# Patient Record
Sex: Male | Born: 1985 | Race: White | Hispanic: No | Marital: Single | Smoking: Current every day smoker
Health system: Southern US, Community
[De-identification: ages and names within clinical notes are randomized; demographics above are authoritative.]

---

## 2017-05-18 ENCOUNTER — Emergency Department
Admission: EM | Admit: 2017-05-18 | Discharge: 2017-05-18 | Disposition: A | Discharge status: Home / Self Care | Payer: Self-pay | Attending: Emergency Medicine | Admitting: Emergency Medicine

## 2017-05-18 DIAGNOSIS — F17210 Nicotine dependence, cigarettes, uncomplicated: Secondary | ICD-10-CM | POA: Insufficient documentation

## 2017-05-18 DIAGNOSIS — G43001 Migraine without aura, not intractable, with status migrainosus: Secondary | ICD-10-CM | POA: Insufficient documentation

## 2017-05-18 MED ORDER — ONDANSETRON HCL 4 MG/2ML IJ SOLN
4.0000 mg | Freq: Once | INTRAMUSCULAR | Status: AC
Start: 2017-05-18 — End: 2017-05-18
  Administered 2017-05-18: 4 mg via INTRAVENOUS
  Filled 2017-05-18: qty 2

## 2017-05-18 MED ORDER — SODIUM CHLORIDE 0.9 % IV BOLUS (SEPSIS)
1000.0000 mL | Freq: Once | INTRAVENOUS | Status: AC
  Administered 2017-05-18: 1000 mL via INTRAVENOUS

## 2017-05-18 MED ORDER — KETOROLAC TROMETHAMINE 30 MG/ML IJ SOLN
30.0000 mg | Freq: Once | INTRAMUSCULAR | Status: AC
Start: 2017-05-18 — End: 2017-05-18
  Administered 2017-05-18: 30 mg via INTRAVENOUS
  Filled 2017-05-18: qty 1

## 2017-05-18 MED ORDER — DIPHENHYDRAMINE HCL 50 MG/ML IJ SOLN
25.0000 mg | Freq: Once | INTRAMUSCULAR | Status: AC
  Administered 2017-05-18: 25 mg via INTRAVENOUS
  Filled 2017-05-18: qty 1

## 2017-05-18 MED ORDER — BUTALBITAL-APAP-CAFFEINE 50-325-40 MG PO TABS: 1.0000 | ORAL_TABLET | Freq: Four times a day (QID) | ORAL | 0 refills | Status: AC | PRN

## 2017-05-18 MED ORDER — METOCLOPRAMIDE HCL 5 MG/ML IJ SOLN
20.0000 mg | Freq: Once | INTRAVENOUS | Status: AC
  Administered 2017-05-18: 20 mg via INTRAVENOUS
  Filled 2017-05-18: qty 4

## 2017-05-18 NOTE — ED Notes (Signed)
Pt presents with headaches 1-2 x per week for a month; now has one that has lasted for 24 hours. He went to urgent care for this headache yesterday and was given phenergan, topamax and benadryl. Pt reports that the headache never got better, but he was able to sleep some. Nausea/vomiting stopped. Pt states he was given prescriptions for the same but has not taken them since it has not been 24 hours. Pt states that he has not seen pcp because up until a month ago, he had rare headaches that would go away with sleep or ibuprofen. Pt alert & oriented with NAD noted.

## 2017-05-18 NOTE — ED Provider Notes (Signed)
[facility] Emergency Department Provider Note   ____________________________________________   First MD Initiated Contact with Patient 05/18/17 1320     (approximate)  I have reviewed the triage vital signs and the nursing notes.   HISTORY  Chief Complaint Headache    HPI Connor Edwards is a 31 y.o. male patient complaining of headache starting yesterday. Patient went to urgent care Clinic and was given Topamax and Benadryl. Patient stated no relief with this medication. Patient state he also had nausea and vomiting associated headache but that has resided to just mild nausea. Patient state onset headaches began about a month ago and has increased to 1-2 a week. Patient said normally headache resolved with anti-inflammatory medication and sleep. Patient also reported a lot of stress in the past 2-3 weeks. Patient denies or associated with this headache. Patient currently rates his painas a 4/10.   History reviewed. No pertinent past medical history.  There are no active problems to display for this patient.   History reviewed. No pertinent surgical history.  Prior to Admission medications   Medication Sig Start Date End Date Taking? Authorizing Provider  butalbital-acetaminophen-caffeine (FIORICET, ESGIC) [phone] MG tablet Take 1-2 tablets by mouth every 6 (six) hours as needed for headache. 05/18/17 05/18/18  Joni ReiningSmith, Ronald K, PA-C    Allergies Patient has no known allergies.  No family history on file.  Social History Social History  Substance Use Topics  . Smoking status: Current Every Day Smoker    Packs/day: 1.00    Types: Cigarettes  . Smokeless tobacco: Never Used  . Alcohol use No    Review of Systems  Constitutional: No fever/chills Eyes: No visual changes. ENT: No sore throat. Cardiovascular: Denies chest pain. Respiratory: Denies shortness of breath. Gastrointestinal: nausea and vomiting Genitourinary: Negative for  dysuria. Musculoskeletal: Negative for back pain. Skin: Negative for rash. Neurological: positive for headaches, but denies focal weakness or numbness.   ____________________________________________   PHYSICAL EXAM:  VITAL SIGNS: ED Triage Vitals  Enc Vitals Group     BP 05/18/17 1135 128/79     Pulse Rate 05/18/17 1135 88     Resp 05/18/17 1135 16     Temp 05/18/17 1135 98.5 F (36.9 C)     Temp Source 05/18/17 1135 Oral     SpO2 05/18/17 1135 100 %     Weight 05/18/17 1135 186 lb (84.4 kg)     Height 05/18/17 1135 5\' 11"  (1.803 m)     Head Circumference --      Peak Flow --      Pain Score 05/18/17 1134 4     Pain Loc --      Pain Edu? --      Excl. in GC? --     Constitutional: Alert and oriented. Well appearing and in no acute distress. Eyes: photophobic Head: Atraumatic. Nose: No congestion/rhinnorhea. Mouth/Throat: Mucous membranes are moist.  Oropharynx non-erythematous. Cardiovascular: Normal rate, regular rhythm. Grossly normal heart sounds.  Good peripheral circulation. Respiratory: Normal respiratory effort.  No retractions. Lungs CTAB. Neurologic:  Normal speech and language. No gross focal neurologic deficits are appreciated. No gait instability. Skin:  Skin is warm, dry and intact. No rash noted. Psychiatric: Mood and affect are normal. Speech and behavior are normal.  ____________________________________________   LABS (all labs ordered are listed, but only abnormal results are displayed)  Labs Reviewed - No data to display ____________________________________________  EKG   ____________________________________________  RADIOLOGY   ____________________________________________  PROCEDURES  Procedure(s) performed: None  Procedures  Critical Care performed: No  ____________________________________________   INITIAL IMPRESSION / ASSESSMENT AND PLAN / ED COURSE  Pertinent labs & imaging results that were available during my care of  the patient were reviewed by me and considered in my medical decision making (see chart for details).  Patient complaint consist of migraine headache. Patient states approximately 80% reduction in headache status post IV Toradol, Reglan, Benadryl, and normal saline.patient given discharge Instructions and a work note. [facility]      ____________________________________________   FINAL CLINICAL IMPRESSION(S) / ED DIAGNOSES  Final diagnoses:  Migraine without aura and with status migrainosus, not intractable      NEW MEDICATIONS STARTED DURING THIS VISIT:  New Prescriptions   BUTALBITAL-ACETAMINOPHEN-CAFFEINE (FIORICET, ESGIC) 50-325-40 MG TABLET    Take 1-2 tablets by mouth every 6 (six) hours as needed for headache.     Note:  This document was prepared using Dragon voice recognition software and may include unintentional dictation errors.    Joni Reining, PA-C 05/18/17 1452    Jene Every, MD 05/18/17 [phone]

## 2017-05-18 NOTE — ED Notes (Signed)
Pt discharged home after verbalizing understanding of discharge instructions; nad noted. 

## 2017-05-18 NOTE — ED Triage Notes (Signed)
Pt reports he started with a headache yesterday - pt went to urgent care and was given topamax and benadryl but obtained no relief - pt reports that the N/V has subsided but the headache persist - pt states that he started having severe headaches about a month ago (1-2 times a week) - pt reports that he is under a lot of stress recently

## 2018-09-23 ENCOUNTER — Other Ambulatory Visit: Payer: Self-pay

## 2018-09-23 ENCOUNTER — Emergency Department
Admission: EM | Admit: 2018-09-23 | Discharge: 2018-09-23 | Disposition: A | Discharge status: Home / Self Care | Payer: Self-pay | Attending: Emergency Medicine | Admitting: Emergency Medicine

## 2018-09-23 DIAGNOSIS — H6121 Impacted cerumen, right ear: Secondary | ICD-10-CM | POA: Insufficient documentation

## 2018-09-23 DIAGNOSIS — H9201 Otalgia, right ear: Secondary | ICD-10-CM

## 2018-09-23 DIAGNOSIS — F17210 Nicotine dependence, cigarettes, uncomplicated: Secondary | ICD-10-CM | POA: Insufficient documentation

## 2018-09-23 MED ORDER — CARBAMIDE PEROXIDE 6.5 % OT SOLN
5.0000 [drp] | Freq: Once | OTIC | Status: AC
  Administered 2018-09-23: 5 [drp] via OTIC
  Filled 2018-09-23: qty 15

## 2018-09-23 NOTE — ED Triage Notes (Signed)
Pt c/o right ear pain.

## 2018-09-23 NOTE — ED Provider Notes (Signed)
[facility] Emergency Department Provider Note ____________________________________________  Time seen: 64  I have reviewed the triage vital signs and the nursing notes.  HISTORY  Chief Complaint  Otalgia   HPI Connor Edwards is a 32 y.o. male's to the ER today with complaint of right ear pain.  He reports this started 4 days ago.  He describes the pain as achy.  He does have associated muffled hearing and ringing in the right ear.  He denies runny nose, nasal congestion, sore throat or cough.  He denies fever, chills or body aches.  He did try to flush his right ear out 4 days ago and his symptoms got worse after that.  History reviewed. No pertinent past medical history.  There are no active problems to display for this patient.   History reviewed. No pertinent surgical history.  Prior to Admission medications   Not on File    Allergies Patient has no known allergies.  No family history on file.  Social History Social History   Tobacco Use  . Smoking status: Current Every Day Smoker    Packs/day: 1.00    Types: Cigarettes  . Smokeless tobacco: Never Used  Substance Use Topics  . Alcohol use: No  . Drug use: No    Review of Systems  Constitutional: Negative for fever, chills or body aches. ENT: Positive for right ear pain, muffled hearing and ringing in the right ear.  Negative for runny nose, nasal congestion or sore throat. Cardiovascular: Negative for chest pain. Respiratory: Negative for shortness of breath. ____________________________________________  PHYSICAL EXAM:  VITAL SIGNS: ED Triage Vitals [09/23/18 1821]  Enc Vitals Group     BP (!) 154/93     Pulse Rate 98     Resp 18     Temp 98 F (36.7 C)     Temp src      SpO2 99 %     Weight 205 lb (93 kg)     Height 5\' 11"  (1.803 m)     Head Circumference      Peak Flow      Pain Score 3     Pain Loc      Pain Edu?      Excl. in GC?     Constitutional: Alert and  oriented. Well appearing and in no distress. Head: Normocephalic and atraumatic. Ears: Left TM gray and intact, normal light reflex.  Right ear with cerumen impaction. Hematological/Lymphatic/Immunological: No cervical lymphadenopathy. Cardiovascular: Normal rate, regular rhythm.  Respiratory: Normal respiratory effort. No wheezes/rales/rhonchi. ____________________________________________  PROCEDURES  .Ear Cerumen Removal Date/Time: 09/23/2018 7:03 PM Performed by: Baron Hamper, RN Authorized by: Lorre Munroe, NP   Consent:    Consent obtained:  Verbal   Consent given by:  Patient and parent   Risks discussed:  Pain and incomplete removal   Alternatives discussed:  No treatment Procedure details:    Location:  R ear   Procedure type: irrigation   Post-procedure details:    Inspection:  Macerated skin and TM intact   Hearing quality:  Improved   Patient tolerance of procedure:  Tolerated well, no immediate complications    ____________________________________________  INITIAL IMPRESSION / ASSESSMENT AND PLAN / ED COURSE  Ot[place]ia, Right Ear secondary to Cerumen Impaction:  Debrox 5 gtts to right ear Manual lavage by RN Reinspection of right ear: canal red, TM grey and intact, normal light reflex, no effusion noted Advised him to try Debrox 2 x week prior to  shower to prevent wax buildup  ____________________________________________    FINAL CLINICAL IMPRESSION(S) / ED DIAGNOSES  Final diagnoses:  Otalgia of right ear  Impacted cerumen of right ear      Lorre Munroe, NP 09/23/18 2002    Arnaldo Natal, MD 09/24/18 [phone]

## 2018-09-23 NOTE — ED Notes (Signed)
r ear irrigated  Large amount of wax removed   Pt states he can hear better

## 2018-09-23 NOTE — Discharge Instructions (Signed)
You were diagnosed with otalgia of the right ear secondary to cerumen impaction.  We were able to lavage her ear and get the wax out.  You can use Debrox OTC 2 times a week to prevent wax from building up in the future.

## 2020-03-15 ENCOUNTER — Emergency Department: Payer: Self-pay

## 2020-03-15 ENCOUNTER — Other Ambulatory Visit: Payer: Self-pay

## 2020-03-15 ENCOUNTER — Emergency Department
Admission: EM | Admit: 2020-03-15 | Discharge: 2020-03-15 | Disposition: A | Discharge status: Home / Self Care | Payer: Self-pay | Attending: Emergency Medicine | Admitting: Emergency Medicine

## 2020-03-15 DIAGNOSIS — F17210 Nicotine dependence, cigarettes, uncomplicated: Secondary | ICD-10-CM | POA: Insufficient documentation

## 2020-03-15 DIAGNOSIS — N200 Calculus of kidney: Secondary | ICD-10-CM | POA: Insufficient documentation

## 2020-03-15 LAB — URINALYSIS, COMPLETE (UACMP) WITH MICROSCOPIC
Bacteria, UA: NONE SEEN
Bilirubin Urine: NEGATIVE
Glucose, UA: NEGATIVE mg/dL
Ketones, ur: NEGATIVE mg/dL
Leukocytes,Ua: NEGATIVE
Nitrite: NEGATIVE
Protein, ur: NEGATIVE mg/dL
Specific Gravity, Urine: 1.025 (ref 1.005–1.030)
Squamous Epithelial / LPF: NONE SEEN (ref 0–5)
pH: 5 (ref 5.0–8.0)

## 2020-03-15 LAB — BASIC METABOLIC PANEL
Anion gap: 10 (ref 5–15)
BUN: 17 mg/dL (ref 6–20)
CO2: 26 mmol/L (ref 22–32)
Calcium: 9.4 mg/dL (ref 8.9–10.3)
Chloride: 105 mmol/L (ref 98–111)
Creatinine, Ser: 1.05 mg/dL (ref 0.61–1.24)
GFR calc Af Amer: >60 mL/min (ref >60)
GFR calc non Af Amer: >60 mL/min (ref >60)
Glucose, Bld: 107 mg/dL — ABNORMAL HIGH (ref 70–99)
Potassium: 4.6 mmol/L (ref 3.5–5.1)
Sodium: 141 mmol/L (ref 135–145)

## 2020-03-15 LAB — CBC
HCT: 49.4 % (ref 39.0–52.0)
Hemoglobin: 16.6 g/dL (ref 13.0–17.0)
MCH: 31.6 pg (ref 26.0–34.0)
MCHC: 33.6 g/dL (ref 30.0–36.0)
MCV: 93.9 fL (ref 80.0–100.0)
Platelets: 273 10*3/uL (ref 150–400)
RBC: 5.26 MIL/uL (ref 4.22–5.81)
RDW: 12.8 % (ref 11.5–15.5)
WBC: 11.5 10*3/uL — ABNORMAL HIGH (ref 4.0–10.5)
nRBC: 0 % (ref 0.0–0.2)

## 2020-03-15 LAB — HEPATIC FUNCTION PANEL
ALT: 32 U/L (ref 0–44)
AST: 28 U/L (ref 15–41)
Albumin: 4.4 g/dL (ref 3.5–5.0)
Alkaline Phosphatase: 43 U/L (ref 38–126)
Bilirubin, Direct: 0.3 mg/dL — ABNORMAL HIGH (ref 0.0–0.2)
Indirect Bilirubin: 0.8 mg/dL (ref 0.3–0.9)
Total Bilirubin: 1.1 mg/dL (ref 0.3–1.2)
Total Protein: 6.9 g/dL (ref 6.5–8.1)

## 2020-03-15 LAB — LIPASE, BLOOD: Lipase: 25 U/L (ref 11–51)

## 2020-03-15 MED ORDER — FENTANYL CITRATE (PF) 100 MCG/2ML IJ SOLN
50.0000 ug | INTRAMUSCULAR | Status: DC | PRN
  Administered 2020-03-15: 50 ug via INTRAVENOUS

## 2020-03-15 MED ORDER — OXYCODONE HCL 5 MG PO TABS: 5.0000 mg | ORAL_TABLET | Freq: Three times a day (TID) | ORAL | 0 refills | Status: AC | PRN

## 2020-03-15 MED ORDER — HYDROMORPHONE HCL 1 MG/ML IJ SOLN
INTRAMUSCULAR | Status: AC
  Filled 2020-03-15: qty 1

## 2020-03-15 MED ORDER — HYDROMORPHONE HCL 1 MG/ML IJ SOLN
0.5000 mg | Freq: Once | INTRAMUSCULAR | Status: AC
  Administered 2020-03-15: 13:00:00 0.5 mg via INTRAVENOUS

## 2020-03-15 MED ORDER — SODIUM CHLORIDE 0.9 % IV BOLUS
1000.0000 mL | Freq: Once | INTRAVENOUS | Status: AC
  Administered 2020-03-15: 1000 mL via INTRAVENOUS

## 2020-03-15 MED ORDER — TAMSULOSIN HCL 0.4 MG PO CAPS: 0.4000 mg | ORAL_CAPSULE | Freq: Every day | ORAL | 0 refills | Status: AC

## 2020-03-15 MED ORDER — OXYCODONE HCL 5 MG PO TABS
5.0000 mg | ORAL_TABLET | Freq: Once | ORAL | Status: AC
  Administered 2020-03-15: 5 mg via ORAL
  Filled 2020-03-15: qty 1

## 2020-03-15 MED ORDER — KETOROLAC TROMETHAMINE 30 MG/ML IJ SOLN
15.0000 mg | Freq: Once | INTRAMUSCULAR | Status: AC
  Administered 2020-03-15: 15 mg via INTRAVENOUS
  Filled 2020-03-15: qty 1

## 2020-03-15 MED ORDER — ONDANSETRON 4 MG PO TBDP: 4.0000 mg | ORAL_TABLET | Freq: Three times a day (TID) | ORAL | 0 refills | Status: AC | PRN

## 2020-03-15 MED ORDER — FENTANYL CITRATE (PF) 100 MCG/2ML IJ SOLN
INTRAMUSCULAR | Status: AC
  Filled 2020-03-15: qty 2

## 2020-03-15 MED ORDER — HYDROMORPHONE HCL 1 MG/ML IJ SOLN
0.5000 mg | Freq: Once | INTRAMUSCULAR | Status: AC
  Administered 2020-03-15: 0.5 mg via INTRAVENOUS
  Filled 2020-03-15: qty 1

## 2020-03-15 MED ORDER — ONDANSETRON HCL 4 MG/2ML IJ SOLN
4.0000 mg | Freq: Once | INTRAMUSCULAR | Status: AC
  Administered 2020-03-15: 11:00:00 4 mg via INTRAVENOUS
  Filled 2020-03-15: qty 2

## 2020-03-15 MED ORDER — IBUPROFEN 600 MG PO TABS: 600.0000 mg | ORAL_TABLET | Freq: Four times a day (QID) | ORAL | 0 refills | Status: AC | PRN

## 2020-03-15 NOTE — ED Provider Notes (Signed)
[facility] Emergency Department Provider Note  ____________________________________________   First MD Initiated Contact with Patient 03/15/20 1054     (approximate)  I have reviewed the triage vital signs and the nursing notes.   HISTORY  Chief Complaint Flank Pain    HPI Connor Edwards is a 34 y.o. male who comes in with right-sided flank pain.  Patient states that he has a history of a kidney stone years ago but states that this feels much worse.  Patient states that he is having right flank pain severe, intermittent, somewhat better now but occasionally will get worse.  Has not taken anything to help with the pain.  Does state that he had a little bit of pain with urination earlier today.  This pain started today.  Denies any hematuria.  Denies any chest pain.       Medical: Prior kidney stone There are no problems to display for this patient.  Surgical: Tonsils removed  Prior to Admission medications   Not on File    Allergies Patient has no known allergies.  No family history on file.  Social History Social History   Tobacco Use  . Smoking status: Current Every Day Smoker    Packs/day: 1.00    Types: Cigarettes  . Smokeless tobacco: Never Used  Substance Use Topics  . Alcohol use: No  . Drug use: No      Review of Systems Constitutional: No fever/chills Eyes: No visual changes. ENT: No sore throat. Cardiovascular: Denies chest pain. Respiratory: Denies shortness of breath. Gastrointestinal: No abdominal pain.  No nausea, no vomiting.  No diarrhea.  No constipation. Genitourinary: Negative for dysuria. Musculoskeletal: Negative for back pain.  Right flank pain Skin: Negative for rash. Neurological: Negative for headaches, focal weakness or numbness. All other ROS negative ____________________________________________   PHYSICAL EXAM:  VITAL SIGNS: ED Triage Vitals [03/15/20 1035]  Enc Vitals Group     BP (!) 145/72     Pulse Rate 84     Resp (!) 24     Temp 97.7 F (36.5 C)     Temp Source Oral     SpO2 97 %     Weight 200 lb (90.7 kg)     Height 5\' 11"  (1.803 m)     Head Circumference      Peak Flow      Pain Score 8     Pain Loc      Pain Edu?      Excl. in GC?     Constitutional: Alert and oriented. Well appearing and in no acute distress. Eyes: Conjunctivae are normal. EOMI. Head: Atraumatic. Nose: No congestion/rhinnorhea. Mouth/Throat: Mucous membranes are moist.   Neck: No stridor. Trachea Midline. FROM Cardiovascular: Normal rate, regular rhythm. Grossly normal heart sounds.  Good peripheral circulation. Respiratory: Normal respiratory effort.  No retractions. Lungs CTAB. Gastrointestinal: Soft and nontender. No distention. No abdominal bruits.  Musculoskeletal: No lower extremity tenderness nor edema.  No joint effusions.  Right flank tenderness Neurologic:  Normal speech and language. No gross focal neurologic deficits are appreciated.  Skin:  Skin is warm, dry and intact. No rash noted. Psychiatric: Mood and affect are normal. Speech and behavior are normal. GU: Deferred   ____________________________________________   LABS (all labs ordered are listed, but only abnormal results are displayed)  Labs Reviewed  URINALYSIS, COMPLETE (UACMP) WITH MICROSCOPIC - Abnormal; Notable for the following components:      Result Value   Color, Urine YELLOW (*)  APPearance CLEAR (*)    Hgb urine dipstick SMALL (*)    All other components within normal limits  BASIC METABOLIC PANEL - Abnormal; Notable for the following components:   Glucose, Bld 107 (*)    All other components within normal limits  CBC - Abnormal; Notable for the following components:   WBC 11.5 (*)    All other components within normal limits  HEPATIC FUNCTION PANEL - Abnormal; Notable for the following components:   Bilirubin, Direct 0.3 (*)    All other components within normal limits  LIPASE, BLOOD    ____________________________________________   RADIOLOGY   Official radiology report(s): CT Renal Stone Study  Result Date: 03/15/2020 CLINICAL DATA:  Patient with right flank pain. EXAM: CT ABDOMEN AND PELVIS WITHOUT CONTRAST TECHNIQUE: Multidetector CT imaging of the abdomen and pelvis was performed following the standard protocol without IV contrast. COMPARISON:  None. FINDINGS: Lower chest: Normal heart size. Lung bases are clear. No pleural effusion. Hepatobiliary: Liver is normal in size and contour. Gallbladder is unremarkable. Pancreas: Unremarkable Spleen: Unremarkable Adrenals/Urinary Tract: Normal adrenal glands. Mild right hydroureteronephrosis to the level of the distal right ureter at the UVJ with there is an obstructing 2 mm stone (image 83; series 2). No additional nephrolithiasis. Kidneys are symmetric in size. Stomach/Bowel: No abnormal bowel wall thickening or evidence for bowel obstruction. Normal appendix. No free fluid or free intraperitoneal air. Normal morphology of the stomach. Vascular/Lymphatic: Normal caliber abdominal aorta. No retroperitoneal lymphadenopathy. Reproductive: Heterogeneous prostate. Other: None. Musculoskeletal: Probable bone island proximal left femur. No aggressive or acute appearing osseous lesions. IMPRESSION: Mild right hydroureteronephrosis to the level of the distal right ureter at the UVJ with there is an obstructing 2 mm stone. Electronically Signed   By: Lovey Newcomer M.D.   On: 03/15/2020 12:25    ____________________________________________   PROCEDURES  Procedure(s) performed (including Critical Care):  Procedures   ____________________________________________   INITIAL IMPRESSION / ASSESSMENT AND PLAN / ED COURSE  Connor Edwards was evaluated in Emergency Department on 03/15/2020 for the symptoms described in the history of present illness. He was evaluated in the context of the global COVID-19 pandemic, which necessitated  consideration that the patient might be at risk for infection with the SARS-CoV-2 virus that causes COVID-19. Institutional protocols and algorithms that pertain to the evaluation of patients at risk for COVID-19 are in a state of rapid change based on information released by regulatory bodies including the CDC and federal and state organizations. These policies and algorithms were followed during the patient's care in the ED.    Patient is a 34 year old who does appear to be in pain with what most most likely a kidney stone.  Will get CT scan to further evaluate, labs to evaluate for electrolyte abnormalities, AKI, urine to evaluate for UTI.  No right lower quadrant pain at this time to suggest appendicitis.  Labs are reassuring.  Kidney stone identified on CT scan is 2 mm and obstructing.  No signs of septic stone given patient is afebrile and no UTI on UA.  Patient is tolerating p.o. has had no vomiting.  Pain is well controlled at this time upon reevaluation.  Discussed with patient he feels comfortable going home on Tylenol, ibuprofen and oxycodone for breakthrough pain, Flomax, Zofran and following up with urology as needed  I discussed the provisional nature of ED diagnosis, the treatment so far, the ongoing plan of care, follow up appointments and return precautions with the patient and any family  or support people present. They expressed understanding and agreed with the plan, discharged home.  ____________________________________________   FINAL CLINICAL IMPRESSION(S) / ED DIAGNOSES   Final diagnoses:  Kidney stone      MEDICATIONS GIVEN DURING THIS VISIT:  Medications  oxyCODONE (Oxy IR/ROXICODONE) immediate release tablet 5 mg (has no administration in time range)  HYDROmorphone (DILAUDID) injection 0.5 mg (0.5 mg Intravenous Given 03/15/20 1116)  ondansetron (ZOFRAN) injection 4 mg (4 mg Intravenous Given 03/15/20 1115)  sodium chloride 0.9 % bolus 1,000 mL (1,000 mLs Intravenous  New Bag/Given 03/15/20 1115)  HYDROmorphone (DILAUDID) injection 0.5 mg (0.5 mg Intravenous Given 03/15/20 1230)  ketorolac (TORADOL) 30 MG/ML injection 15 mg (15 mg Intravenous Given 03/15/20 1250)     ED Discharge Orders         Ordered    ondansetron (ZOFRAN ODT) 4 MG disintegrating tablet  Every 8 hours PRN     03/15/20 1329    ibuprofen (ADVIL) 600 MG tablet  Every 6 hours PRN     03/15/20 1329    oxyCODONE (ROXICODONE) 5 MG immediate release tablet  Every 8 hours PRN     03/15/20 1329    tamsulosin (FLOMAX) 0.4 MG CAPS capsule  Daily     03/15/20 1329           Note:  This document was prepared using Dragon voice recognition software and may include unintentional dictation errors.   Concha Se, MD 03/15/20 1330

## 2020-03-15 NOTE — ED Notes (Addendum)
Pt states pain came back, appears uncomfortable. Family member at bedside.

## 2020-03-15 NOTE — ED Triage Notes (Signed)
PT arrived via POV with reports of right flank pain that started about 30 minutes prior to arrival, hx of kidney stones but states the pain has never been this bad.  Pt denies any N/V at this time. Appears uncomfortable in triage.

## 2020-03-15 NOTE — Discharge Instructions (Addendum)
Take Tylenol 1 g every 8 hours, ibuprofen 600 every 6 hours with food and the oxycodone for breakthrough pain.  Do not drive or work while on this.  Take the Flomax to help dilate the urethra and Zofran to help with nausea.  Return to the ER for fevers, vomiting, worsening pain or other other concerns  IMPRESSION: Mild right hydroureteronephrosis to the level of the distal right ureter at the UVJ with there is an obstructing 2 mm stone.

## 2020-09-12 IMAGING — CT CT RENAL STONE PROTOCOL
3 of 4 series · 9 of 46 positions shown, 16 images · non-contrast
Comparison: None.

CLINICAL DATA: Patient with right flank pain.

EXAM:
CT ABDOMEN AND PELVIS WITHOUT CONTRAST
TECHNIQUE: Multidetector CT imaging of the abdomen and pelvis was performed
following the standard protocol without IV contrast.

[Series 4: lung bases · axial · 0.87mm/px · z∈[-614,-514]mm · 5 of 30 slices shown, 10 images]
[im 5/30  soft-tissue]
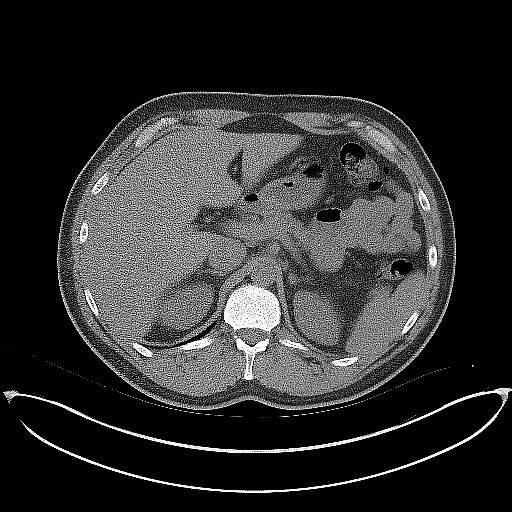
[im 5/30  bone]
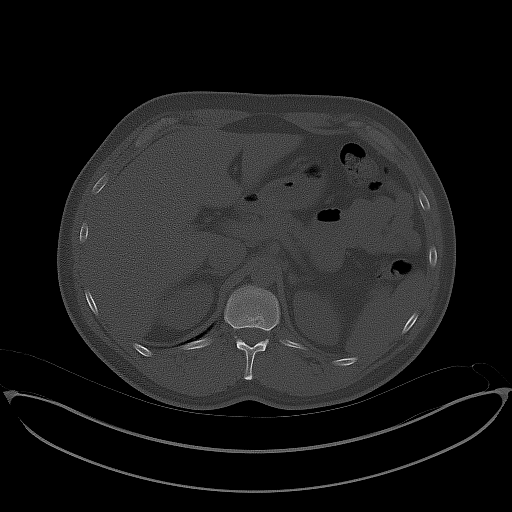
[im 10/30  soft-tissue]
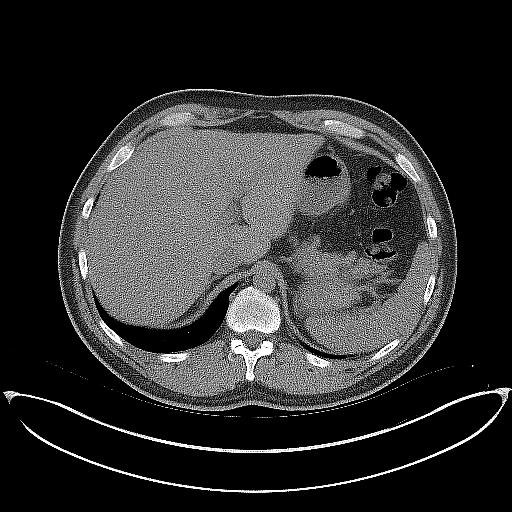
[im 10/30  lung]
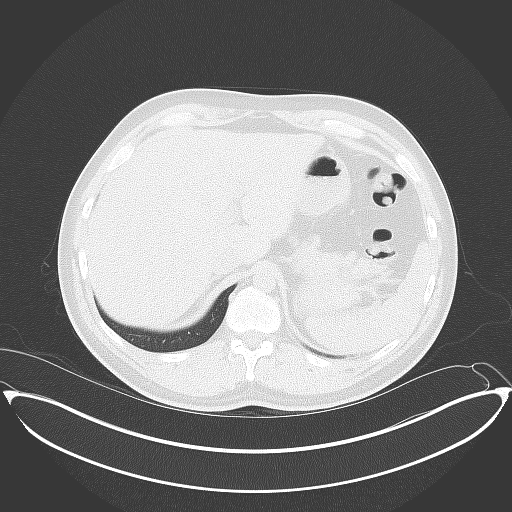
[im 15/30  soft-tissue]
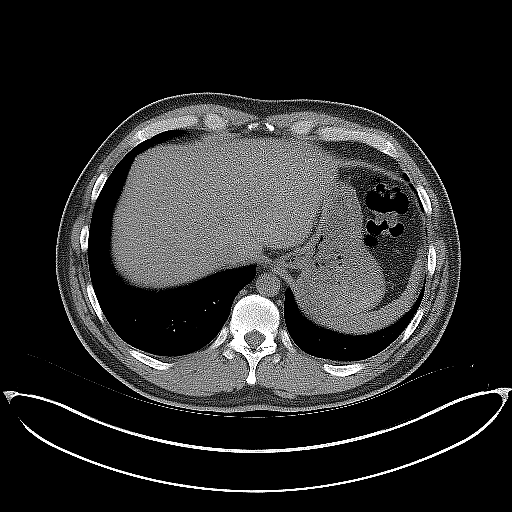
[im 15/30  lung]
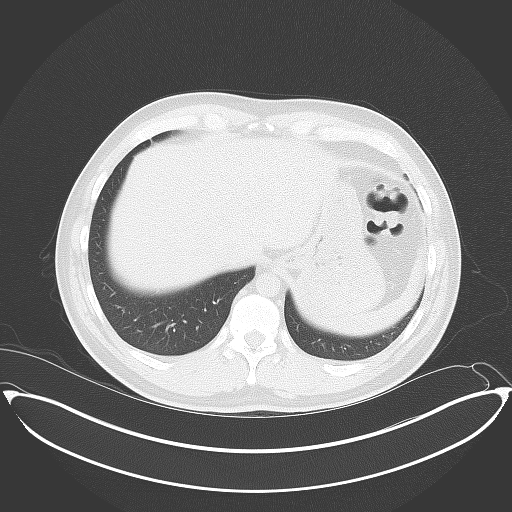
[im 20/30  soft-tissue]
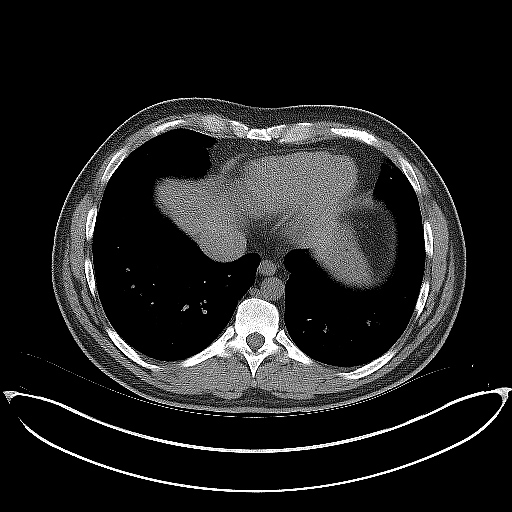
[im 20/30  lung]
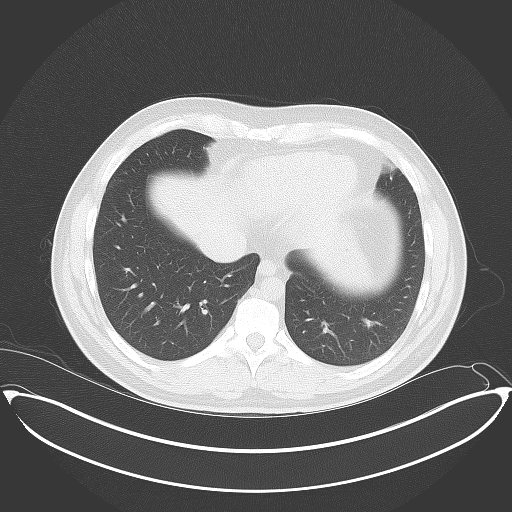
[im 25/30  soft-tissue]
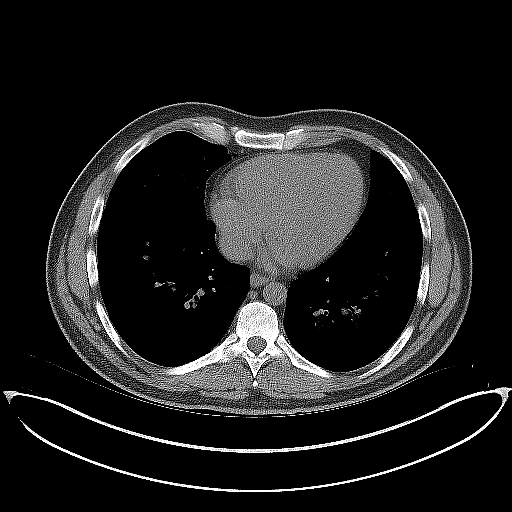
[im 25/30  lung]
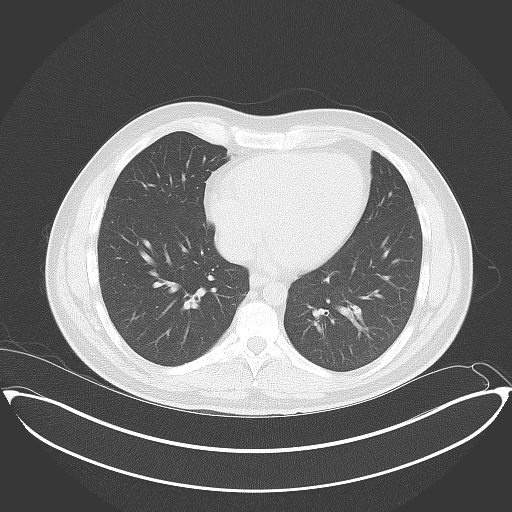

[Series 5: coronal · coronal · 0.85mm/px · 3 of 151 slices shown, 4 images]
[im 51/151  soft-tissue]
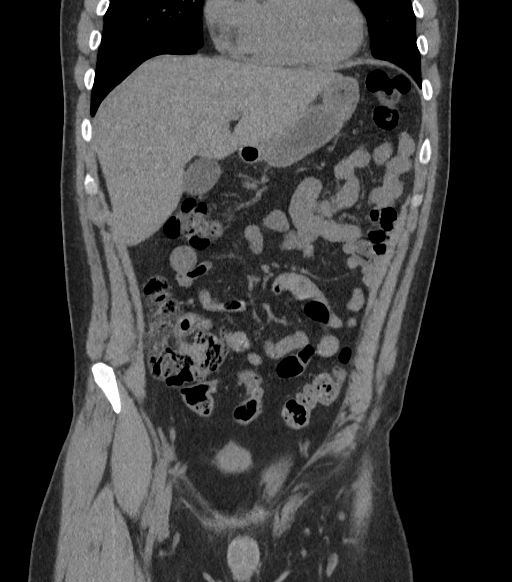
[im 67/151  soft-tissue]
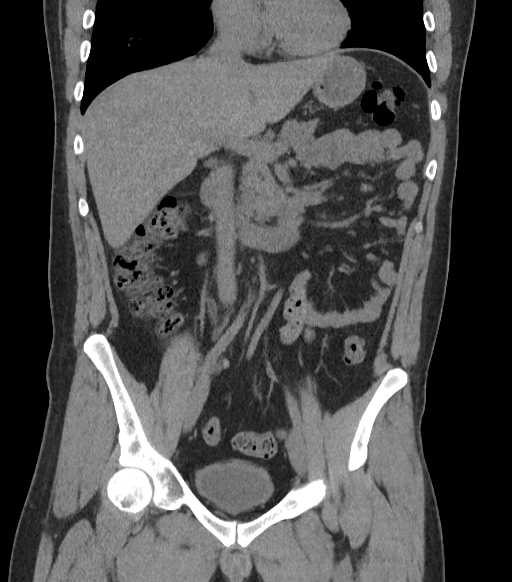
[im 67/151  bone]
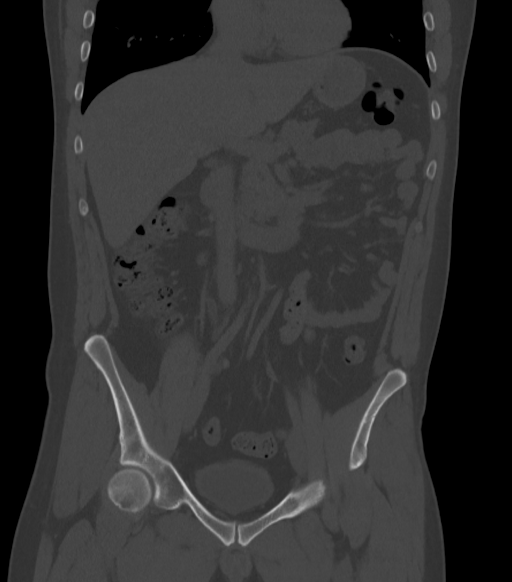
[im 84/151  soft-tissue]
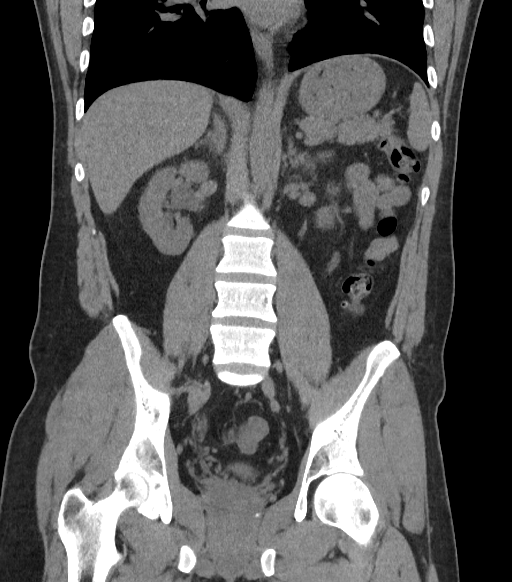

[Series 6: sagittal · sagittal · 0.75mm/px · 1 of 205 slices shown, 2 images]
[im 69/205  soft-tissue]
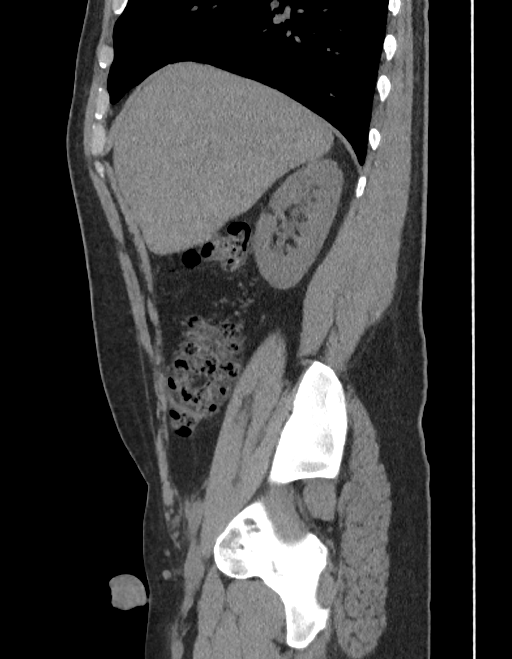
[im 69/205  bone]
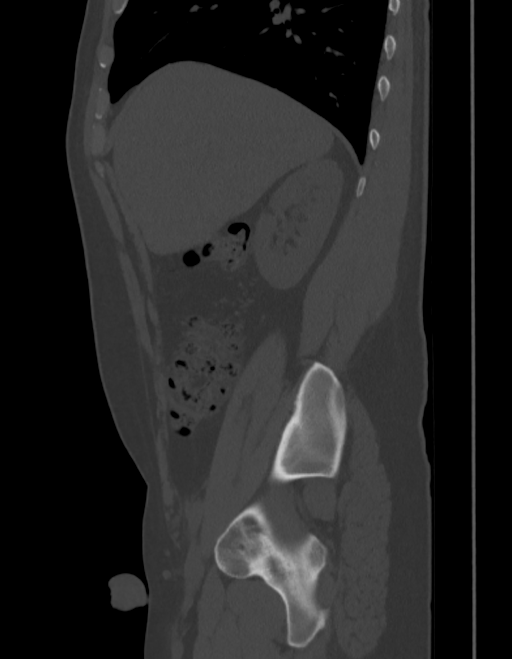

[9 of 46 positions shown; findings below may reference images not displayed]

FINDINGS: Lower chest: Normal heart size. Lung bases are clear. No pleural
effusion.

Hepatobiliary: Liver is normal in size and contour. Gallbladder is
unremarkable.

Pancreas: Unremarkable

Spleen: Unremarkable

Adrenals/Urinary Tract: Normal adrenal glands. Mild right
hydroureteronephrosis to the level of the distal right ureter at the
UVJ with there is an obstructing 2 mm stone (image 83; series 2). No
additional nephrolithiasis. Kidneys are symmetric in size.

Stomach/Bowel: No abnormal bowel wall thickening or evidence for
bowel obstruction. Normal appendix. No free fluid or free
intraperitoneal air. Normal morphology of the stomach.

Vascular/Lymphatic: Normal caliber abdominal aorta. No
retroperitoneal lymphadenopathy.

Reproductive: Heterogeneous prostate.

Other: None.

Musculoskeletal: Probable bone island proximal left femur. No
aggressive or acute appearing osseous lesions.
IMPRESSION: Mild right hydroureteronephrosis to the level of the distal right
ureter at the UVJ with there is an obstructing 2 mm stone.
